# Patient Record
Sex: Male | Born: 1985
Health system: Southern US, Community
[De-identification: ages and names within clinical notes are randomized; demographics above are authoritative.]

---

## 2017-12-02 ENCOUNTER — Encounter: Payer: Self-pay | Admitting: Medical

## 2017-12-02 ENCOUNTER — Ambulatory Visit (HOSPITAL_BASED_OUTPATIENT_CLINIC_OR_DEPARTMENT_OTHER)
Admission: RE | Admit: 2017-12-02 | Discharge: 2017-12-02 | Disposition: A | Discharge status: Home / Self Care | Payer: BLUE CROSS/BLUE SHIELD | Source: Ambulatory Visit | Attending: Medical | Admitting: Medical

## 2017-12-02 ENCOUNTER — Ambulatory Visit (INDEPENDENT_AMBULATORY_CARE_PROVIDER_SITE_OTHER): Payer: BLUE CROSS/BLUE SHIELD | Admitting: Medical

## 2017-12-02 VITALS — BP 112/71 | HR 61 | Temp 98.7°F | Resp 16 | Ht 65.0 in | Wt 167.2 lb

## 2017-12-02 DIAGNOSIS — M545 Low back pain, unspecified: Secondary | ICD-10-CM

## 2017-12-02 DIAGNOSIS — G8929 Other chronic pain: Secondary | ICD-10-CM | POA: Insufficient documentation

## 2017-12-02 DIAGNOSIS — M549 Dorsalgia, unspecified: Secondary | ICD-10-CM

## 2017-12-02 DIAGNOSIS — R300 Dysuria: Secondary | ICD-10-CM | POA: Diagnosis not present

## 2017-12-02 LAB — COMPREHENSIVE METABOLIC PANEL
ALBUMIN: 4.7 g/dL (ref 3.5–5.2)
ALK PHOS: 69 U/L (ref 39–117)
ALT: 17 U/L (ref 0–53)
AST: 18 U/L (ref 0–37)
BILIRUBIN TOTAL: 0.4 mg/dL (ref 0.2–1.2)
BUN: 14 mg/dL (ref 6–23)
CO2: 29 mEq/L (ref 19–32)
Calcium: 9.9 mg/dL (ref 8.4–10.5)
Chloride: 104 mEq/L (ref 96–112)
Creatinine, Ser: 1.05 mg/dL (ref 0.40–1.50)
GFR: 87.1 mL/min (ref >60.00)
Glucose, Bld: 90 mg/dL (ref 70–99)
POTASSIUM: 3.9 meq/L (ref 3.5–5.1)
Sodium: 141 mEq/L (ref 135–145)
TOTAL PROTEIN: 7.3 g/dL (ref 6.0–8.3)

## 2017-12-02 LAB — POC URINALSYSI DIPSTICK (AUTOMATED)
Bilirubin, UA: NEGATIVE
Glucose, UA: NEGATIVE
KETONES UA: NEGATIVE
LEUKOCYTES UA: NEGATIVE
NITRITE UA: NEGATIVE
PH UA: 6 (ref 5.0–8.0)
PROTEIN UA: NEGATIVE
RBC UA: NEGATIVE
Spec Grav, UA: 1.02 (ref 1.010–1.025)
Urobilinogen, UA: NEGATIVE E.U./dL — AB

## 2017-12-02 MED ORDER — DICLOFENAC SODIUM 75 MG PO TBEC: 75.0000 mg | DELAYED_RELEASE_TABLET | Freq: Two times a day (BID) | ORAL | 0 refills | Status: AC

## 2017-12-02 MED ORDER — DOXYCYCLINE HYCLATE 100 MG PO TABS: 100.0000 mg | ORAL_TABLET | Freq: Two times a day (BID) | ORAL | 0 refills | Status: AC

## 2017-12-02 MED FILL — DICLOFENAC SOD EC 75 MG TAB: 75 | 15 days supply | Qty: 30 | Fill #0

## 2017-12-02 MED FILL — DOXYCYCLINE HYCLATE 100 MG: 100 | 7 days supply | Qty: 14 | Fill #0

## 2017-12-02 NOTE — Progress Notes (Signed)
Subjective:    Patient ID: Samuel Rogers, male    DOB: September 24, 1985, 32 y.o.   MRN: [id number]  HPI  Pt in for first time.  Pt works Network engineer. State moderate healthy diet. No exercise other than work. Drinks one cup coffee a day. Drinks about 12 beers every 2 weeks on weekend. Married- 3 children.  Pt in states he has some back pain rt side. Pt states pain one year. Pt states pain low level 1-2/10. Sometimes notes if has not drank water adequate will have pain. No pain radiating to abdomen. No Pain radiating to legs. Pt states actually when working does not note pain. But states feels pain more at night. Pt does not take anything for pain.  Pt went to clinic about 4 months ago. Pt told maybe had infection in urine. Pt does not remember the name. No tests done.  Pt states also some mid pain on urination for 2 weeks. Pt does not report any colored dc. Pt has relations with wife only. No testicle pain.     Review of Systems  Constitutional: Negative for chills, fatigue and fever.  Respiratory: Negative for cough, chest tightness and wheezing.   Cardiovascular: Negative for chest pain and palpitations.  Genitourinary: Positive for dysuria. Negative for difficulty urinating, genital sores, hematuria, penile pain, penile swelling, scrotal swelling and testicular pain.  Musculoskeletal: Positive for back pain.  Neurological: Negative for dizziness, speech difficulty, weakness, light-headedness and headaches.    History reviewed. No pertinent past medical history.   Social History   Socioeconomic History  . Marital status: Married    Spouse name: Not on file  . Number of children: Not on file  . Years of education: Not on file  . Highest education level: Not on file  Occupational History  . Not on file  Social Needs  . Financial resource strain: Not on file  . Food insecurity:    Worry: Not on file    Inability: Not on file  . Transportation needs:    Medical: Not on file   Non-medical: Not on file  Tobacco Use  . Smoking status: Never Smoker  . Smokeless tobacco: Never Used  Substance and Sexual Activity  . Alcohol use: Yes    Comment: 12 beers every 2 weeks on weekends.  . Drug use: Never  . Sexual activity: Yes  Lifestyle  . Physical activity:    Days per week: Not on file    Minutes per session: Not on file  . Stress: Not on file  Relationships  . Social connections:    Talks on phone: Not on file    Gets together: Not on file    Attends religious service: Not on file    Active member of club or organization: Not on file    Attends meetings of clubs or organizations: Not on file    Relationship status: Not on file  . Intimate partner violence:    Fear of current or ex partner: Not on file    Emotionally abused: Not on file    Physically abused: Not on file    Forced sexual activity: Not on file  Other Topics Concern  . Not on file  Social History Narrative  . Not on file    History reviewed. No pertinent surgical history.  History reviewed. No pertinent family history.  Not on File  No current outpatient medications on file prior to visit.   No current facility-administered medications on file prior to visit.  BP 112/71   Pulse 61   Temp 98.7 F (37.1 C) (Oral)   Resp 16   Ht 5\' 5"  (1.651 m)   Wt 167 lb 3.2 oz (75.8 kg)   SpO2 100%   BMI 27.82 kg/m       Objective:   Physical Exam  General Appearance- Not in acute distress.    Chest and Lung Exam Auscultation: Breath sounds:-Normal. Clear even and unlabored. Adventitious sounds:- No Adventitious sounds.  Cardiovascular Auscultation:Rythm - Regular, rate and rythm. Heart Sounds -Normal heart sounds.  Abdomen Inspection:-Inspection Normal.  Palpation/Perucssion: Palpation and Percussion of the abdomen reveal- Non Tender, No Rebound tenderness, No rigidity(Guarding) and No Palpable abdominal masses.  Liver:-Normal.  Spleen:- Normal.   Back No Mid lumbar  spine tenderness to palpation. Pain on straight leg lift Pain on lateral movements and flexion/extension of the spine. Lt side paralumbar tenderness. Faint cva region pain  Lower ext neurologic  L5-S1 sensation intact bilaterally. Normal patellar reflexes bilaterally. No foot drop bilaterally.  Genital  Exam- uncirumised. Normal glands no dc. Testicle nontender      Assessment & Plan:  For your chronic low-level back pain over last year, I do want to get lumbar spine x-ray today and will give you low-dose diclofenac to use when level of pain is moderate.  Would go ahead and recommend using for the next 3 to 4 days to see if your current pain can be resolved.  Also want you to make sure that you do daily stretching exercises provided on this summary.  You mentioned some correlation of back pain when you potentially could be dehydrated.  With your line of work definitely make sure you are hydrated.  Would recommend using [employer] water or sugar-free Gatorade particularly during the summer.  Since you do report some costovertebral angle region pain intermittently will go ahead and check your kidney function.  Recent minimal burning on urination at tip of penis.  This by exam and description appears likely nonspecific urethritis.  Will prescribe doxycycline antibiotic.  Rx advisement given.  After review of UA might get urine culture.  If your symptoms persist despite doxycycline and will get urine ancillary studies.  On follow-up in 2 weeks or as needed.  Esperanza RichtersEdward Cortney Mckinney, PA-C

## 2017-12-02 NOTE — Patient Instructions (Addendum)
For your chronic low-level back pain over last year, I do want to get lumbar spine x-ray today and will give you low-dose diclofenac to use when level of pain is moderate.  Would go ahead and recommend using for the next 3 to 4 days to see if your current pain can be resolved.  Also want you to make sure that you do daily stretching exercises provided on this summary.  You mentioned some correlation of back pain when you potentially could be dehydrated.  With your line of work definitely make sure you are hydrated.  Would recommend using [employer] water or sugar-free Gatorade particularly during the summer.  Since you do report some costovertebral angle region pain intermittently will go ahead and check your kidney function.  Recent minimal burning on urination at tip of penis.  This by exam and description appears likely nonspecific urethritis.  Will prescribe doxycycline antibiotic.  Rx advisement given.  After review of UA might get urine culture.  If your symptoms persist despite doxycycline and will get urine ancillary studies.  On follow-up in 2 weeks or as needed.   Back Exercises If you have pain in your back, do these exercises 2-3 times each day or as told by your doctor. When the pain goes away, do the exercises once each day, but repeat the steps more times for each exercise (do more repetitions). If you do not have pain in your back, do these exercises once each day or as told by your doctor. Exercises Single Knee to Chest  Do these steps 3-5 times in a row for each leg: 1. Lie on your back on a firm bed or the floor with your legs stretched out. 2. Bring one knee to your chest. 3. Hold your knee to your chest by grabbing your knee or thigh. 4. Pull on your knee until you feel a gentle stretch in your lower back. 5. Keep doing the stretch for 10-30 seconds. 6. Slowly let go of your leg and straighten it.  Pelvic Tilt  Do these steps 5-10 times in a row: 1. Lie on your  back on a firm bed or the floor with your legs stretched out. 2. Bend your knees so they point up to the ceiling. Your feet should be flat on the floor. 3. Tighten your lower belly (abdomen) muscles to press your lower back against the floor. This will make your tailbone point up to the ceiling instead of pointing down to your feet or the floor. 4. Stay in this position for 5-10 seconds while you gently tighten your muscles and breathe evenly.  Cat-Cow  Do these steps until your lower back bends more easily: 1. Get on your hands and knees on a firm surface. Keep your hands under your shoulders, and keep your knees under your hips. You may put padding under your knees. 2. Let your head hang down, and make your tailbone point down to the floor so your lower back is round like the back of a cat. 3. Stay in this position for 5 seconds. 4. Slowly lift your head and make your tailbone point up to the ceiling so your back hangs low (sags) like the back of a cow. 5. Stay in this position for 5 seconds.  Press-Ups  Do these steps 5-10 times in a row: 1. Lie on your belly (face-down) on the floor. 2. Place your hands near your head, about shoulder-width apart. 3. While you keep your back relaxed and keep your  hips on the floor, slowly straighten your arms to raise the top half of your body and lift your shoulders. Do not use your back muscles. To make yourself more comfortable, you may change where you place your hands. 4. Stay in this position for 5 seconds. 5. Slowly return to lying flat on the floor.  Bridges  Do these steps 10 times in a row: 1. Lie on your back on a firm surface. 2. Bend your knees so they point up to the ceiling. Your feet should be flat on the floor. 3. Tighten your butt muscles and lift your butt off of the floor until your waist is almost as high as your knees. If you do not feel the muscles working in your butt and the back of your thighs, slide your feet 1-2 inches  farther away from your butt. 4. Stay in this position for 3-5 seconds. 5. Slowly lower your butt to the floor, and let your butt muscles relax.  If this exercise is too easy, try doing it with your arms crossed over your chest. Belly Crunches  Do these steps 5-10 times in a row: 1. Lie on your back on a firm bed or the floor with your legs stretched out. 2. Bend your knees so they point up to the ceiling. Your feet should be flat on the floor. 3. Cross your arms over your chest. 4. Tip your chin a little bit toward your chest but do not bend your neck. 5. Tighten your belly muscles and slowly raise your chest just enough to lift your shoulder blades a tiny bit off of the floor. 6. Slowly lower your chest and your head to the floor.  Back Lifts Do these steps 5-10 times in a row: 1. Lie on your belly (face-down) with your arms at your sides, and rest your forehead on the floor. 2. Tighten the muscles in your legs and your butt. 3. Slowly lift your chest off of the floor while you keep your hips on the floor. Keep the back of your head in line with the curve in your back. Look at the floor while you do this. 4. Stay in this position for 3-5 seconds. 5. Slowly lower your chest and your face to the floor.  Contact a doctor if:  Your back pain gets a lot worse when you do an exercise.  Your back pain does not lessen 2 hours after you exercise. If you have any of these problems, stop doing the exercises. Do not do them again unless your doctor says it is okay. Get help right away if:  You have sudden, very bad back pain. If this happens, stop doing the exercises. Do not do them again unless your doctor says it is okay. This information is not intended to replace advice given to you by your health care provider. Make sure you discuss any questions you have with your health care provider. Document Released: 01/30/2010 Document Revised: 06/05/2015 Document Reviewed: 02/21/2014 Elsevier  Interactive Patient Education  [employer].

## 2017-12-16 ENCOUNTER — Ambulatory Visit: Payer: BLUE CROSS/BLUE SHIELD | Admitting: Medical

## 2017-12-26 ENCOUNTER — Ambulatory Visit: Payer: BLUE CROSS/BLUE SHIELD | Admitting: Medical

## 2017-12-26 DIAGNOSIS — Z0289 Encounter for other administrative examinations: Secondary | ICD-10-CM

## 2019-12-29 IMAGING — DX DG LUMBAR SPINE 2-3V
3 series · 3 of 3 positions shown · non-contrast
Comparison: No prior.

CLINICAL DATA: Low back pain.

EXAM:
LUMBAR SPINE - 2-3 VIEW

[l-spine ap]
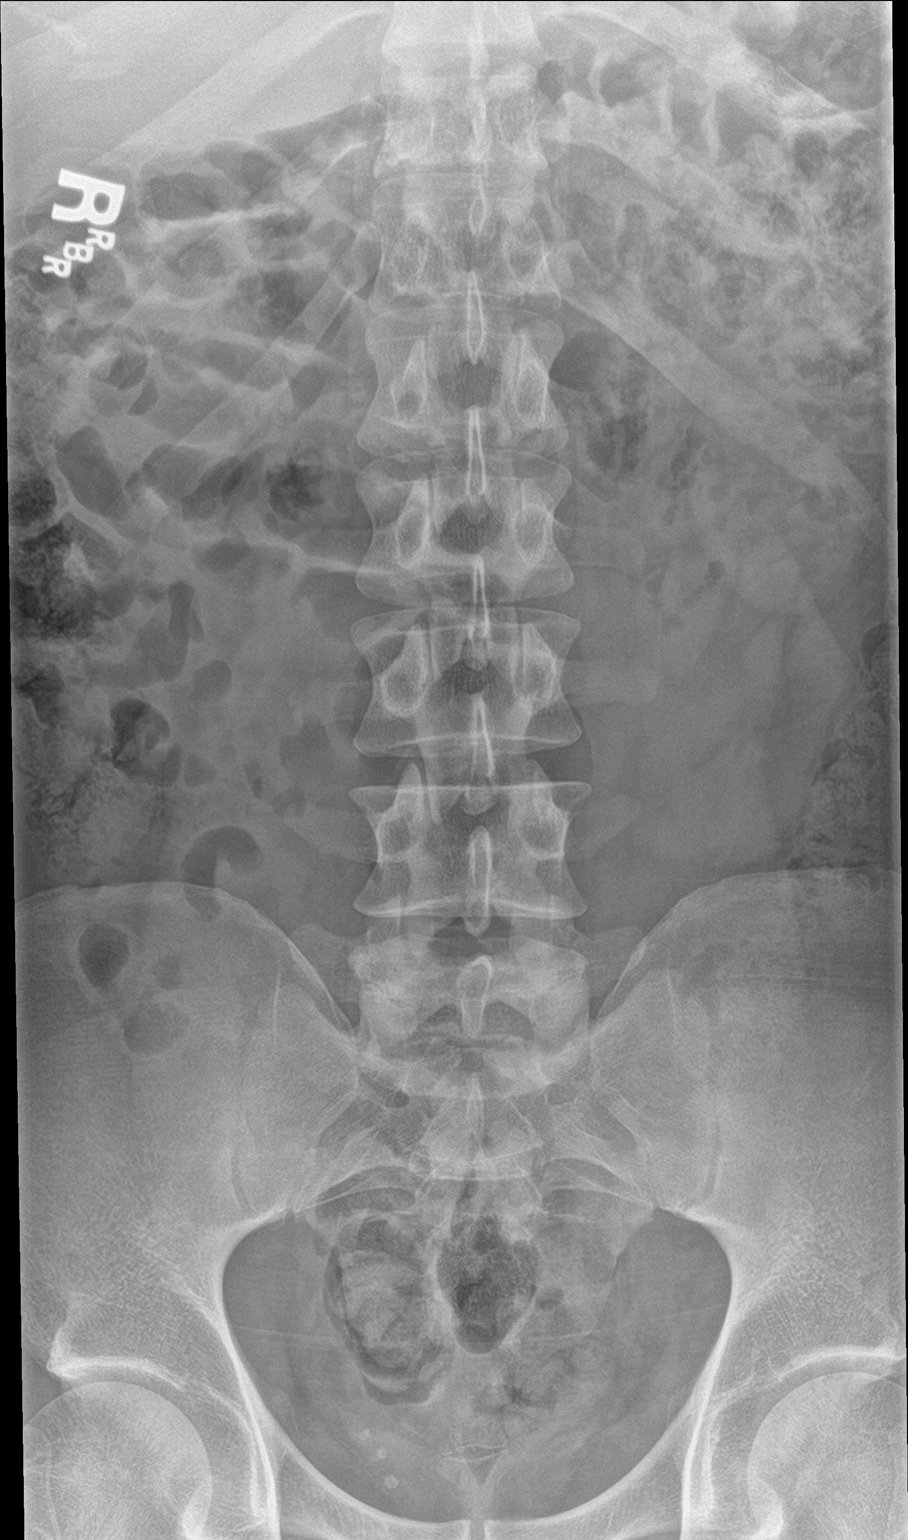

[l-spine lat]
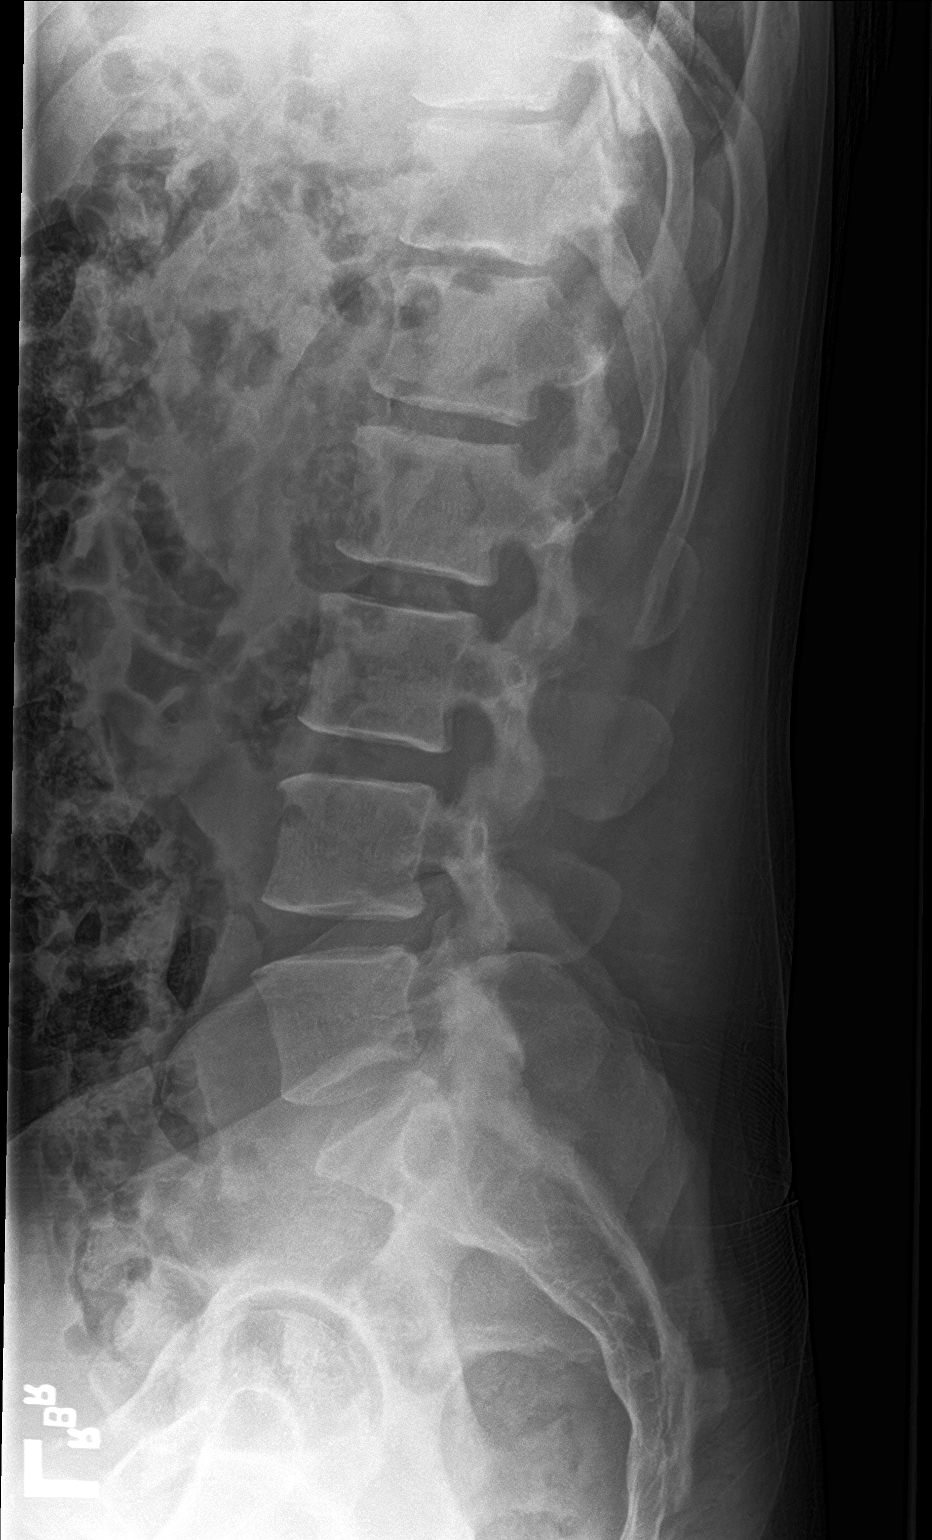

[l-spine spot]
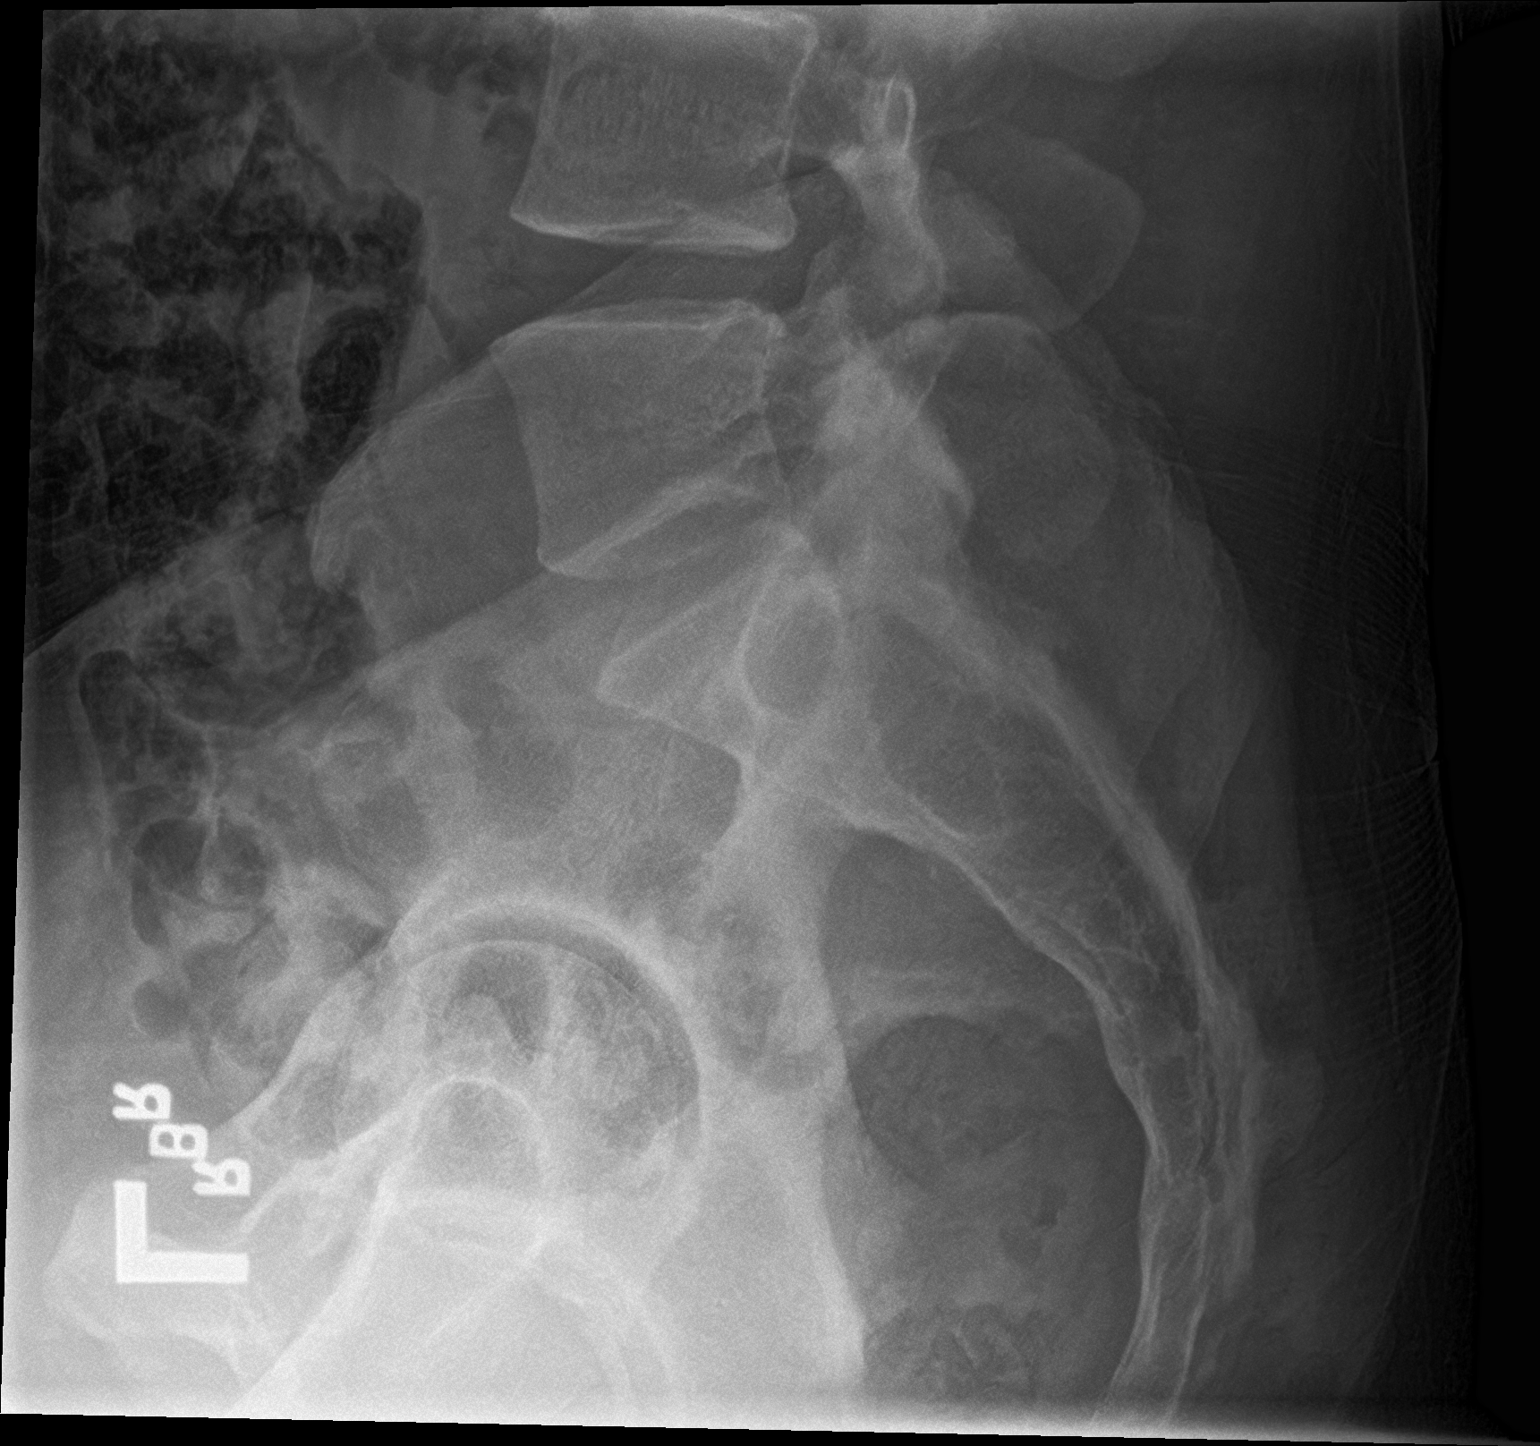

[3 of 3 positions shown; findings below may reference images not displayed]

FINDINGS: Paraspinal soft tissues normal. No acute bony abnormality
identified. No evidence fracture. Pelvic calcifications consistent
with phleboliths.
IMPRESSION: No acute or focal abnormality.
